# Patient Record
Sex: Female | Born: 1995
Health system: Southern US, Community
[De-identification: ages and names within clinical notes are randomized; demographics above are authoritative.]

## PROBLEM LIST (undated history)

## (undated) DIAGNOSIS — K589 Irritable bowel syndrome without diarrhea: Secondary | ICD-10-CM

## (undated) DIAGNOSIS — K219 Gastro-esophageal reflux disease without esophagitis: Secondary | ICD-10-CM

## (undated) DIAGNOSIS — F429 Obsessive-compulsive disorder, unspecified: Secondary | ICD-10-CM

## (undated) DIAGNOSIS — F419 Anxiety disorder, unspecified: Secondary | ICD-10-CM

## (undated) HISTORY — DX: Gastro-esophageal reflux disease without esophagitis: K21.9

## (undated) HISTORY — DX: Irritable bowel syndrome without diarrhea: K58.9

## (undated) HISTORY — DX: Anxiety disorder, unspecified: F41.9

## (undated) HISTORY — DX: Obsessive-compulsive disorder, unspecified: F42.9

---
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
## 2019-09-11 ENCOUNTER — Encounter: Payer: Self-pay | Admitting: Nurse Practitioner

## 2019-09-20 ENCOUNTER — Other Ambulatory Visit (INDEPENDENT_AMBULATORY_CARE_PROVIDER_SITE_OTHER): Payer: BC Managed Care – PPO

## 2019-09-20 ENCOUNTER — Encounter: Payer: Self-pay | Admitting: Nurse Practitioner

## 2019-09-20 ENCOUNTER — Encounter (INDEPENDENT_AMBULATORY_CARE_PROVIDER_SITE_OTHER): Payer: Self-pay

## 2019-09-20 ENCOUNTER — Ambulatory Visit: Payer: BC Managed Care – PPO | Admitting: Nurse Practitioner

## 2019-09-20 VITALS — BP 108/64 | HR 86 | Temp 98.7°F | Ht 68.75 in | Wt 154.0 lb

## 2019-09-20 DIAGNOSIS — R197 Diarrhea, unspecified: Secondary | ICD-10-CM

## 2019-09-20 DIAGNOSIS — Z8719 Personal history of other diseases of the digestive system: Secondary | ICD-10-CM

## 2019-09-20 DIAGNOSIS — R140 Abdominal distension (gaseous): Secondary | ICD-10-CM

## 2019-09-20 LAB — IGA: IgA: 120 mg/dL (ref 68–378)

## 2019-09-20 LAB — CBC
HCT: 36.3 % (ref 36.0–46.0)
Hemoglobin: 12.5 g/dL (ref 12.0–15.0)
MCHC: 34.3 g/dL (ref 30.0–36.0)
MCV: 86.5 fl (ref 78.0–100.0)
Platelets: 267 10*3/uL (ref 150.0–400.0)
RBC: 4.2 Mil/uL (ref 3.87–5.11)
RDW: 13.1 % (ref 11.5–15.5)
WBC: 5.5 10*3/uL (ref 4.0–10.5)

## 2019-09-20 LAB — HIGH SENSITIVITY CRP: CRP, High Sensitivity: 2.26 mg/L (ref 0.000–5.000)

## 2019-09-20 LAB — SEDIMENTATION RATE: Sed Rate: 12 mm/hr (ref 0–20)

## 2019-09-20 NOTE — Patient Instructions (Addendum)
If you are age 25 or older, your body mass index should be between 23-30. Your Body mass index is 22.91 kg/m. If this is out of the aforementioned range listed, please consider follow up with your Primary Care Provider.  If you are age 75 or younger, your body mass index should be between 19-25. Your Body mass index is 22.91 kg/m. If this is out of the aformentioned range listed, please consider follow up with your Primary Care Provider.   Your provider has requested that you go to the basement level for lab work before leaving today. Press "B" on the elevator. The lab is located at the first door on the left as you exit the elevator.  Follow up with me on 10/10/19 at 10 am.  Thank you for choosing me and [facility].   Willette Cluster, NP

## 2019-09-20 NOTE — Progress Notes (Signed)
ASSESSMENT / PLAN:   24 year old female with GAD, OCD and IBS  #History of IBS --Presenting with diarrhea, bloating, "gassiness".  --She can correlate symptoms with anxiety.  This recent flare in symptoms which started in January occurred after injuring foot and being on lockdown in [place].  Though back home now in [place] her symptoms have persisted though this has been in the setting of some recent medication changes for OCD/anxiety which she said increased her anxiety --She has been eating more weight than normal, wonders about celiac --Suspect symptoms are secondary to IBS.  Will rule check TTG and IgA for celiac disease. --Obtain ESR, CRP, CBC --Stool for lactoferrin --Follow-up with me in 3 weeks  HPI:     Chief Complaint: Diarrhea, bloating   Kelsey Armstrong is a 24 year old female, self-referred for evaluation of diarrhea and bloating.  Patient has been working on her masters degree in [place] history in [place].. She gives a history of IBS diagnosed in 2015 after a stomach flu.  She has been on probiotics ever since and done fairly well unless she stops the probiotics or during times of increased stress.  This past January patient injured her foot leading to decreased mobility.  This increased her level of stress /anxiety.  She subsequently developed diarrhea, bloating and gassiness.  On average she has 2-3 bowel movements a day.  60% of the time stools are loose.  Remainder of the time stools vary in consistency from being almost formed to soft.  She has not seen any blood in her stool.  No nocturnal stooling.  No unusual joint aches.  No family history of IBD .  She has not really tried anything for the diarrhea such as Imodium since 2015.  Patient saw a healthcare provider in [place] at the time and was prescribed an herbal tincture and things improved.  Now back with recurrent symptoms.  Patient does recall that she has been eating more wheat than normal.  She wonders  about celiac disease.  She has not noticed any correlation between her symptoms and dairy.  Has had some medication adjustments recently.  She was on BuSpar but it increased her anxiety (in February).  Her Prozac dose was recently increased but it also made the anxiety worse.  Past Medical History:  Diagnosis Date   Anxiety    GERD (gastroesophageal reflux disease)    IBS (irritable bowel syndrome)    OCD (obsessive compulsive disorder)      History reviewed. No pertinent surgical history. Family History  Problem Relation Age of Onset   Kidney cancer Maternal Grandfather    Lung cancer Maternal Grandfather    Colon cancer Paternal Grandmother    Breast cancer Paternal Grandmother    Skin cancer Paternal Grandmother    Social History   Tobacco Use   Smoking status: Never Smoker  Substance Use Topics   Alcohol use: Yes    Alcohol/week: 1.0 standard drinks    Types: 1 Glasses of wine per week    Comment: occasionally   Drug use: Never   Current Outpatient Medications  Medication Sig Dispense Refill   AMBULATORY NON FORMULARY MEDICATION Take 1 Dose by mouth in the morning, at noon, and at bedtime. Medication Name: Iberogast (natural ingredients)     Ascorbic Acid (VITAMIN C) 100 MG tablet Take 100 mg by mouth daily.     cholecalciferol (VITAMIN D3) 25 MCG (1000 UNIT) tablet Take 1,000 Units  by mouth daily.     FLUoxetine (PROZAC) 40 MG capsule Take by mouth.     lactobacillus acidophilus (BACID) TABS tablet Take 2 tablets by mouth 3 (three) times daily.     Multiple Vitamin (MULTIVITAMIN) tablet Take 1 tablet by mouth daily.     Probiotic Product (PROBIOTIC DAILY PO) Take 1 capsule by mouth daily.     No current facility-administered medications for this visit.   No Known Allergies   Review of Systems: Positive for anxiety, allergies, sore throat.  All other systems reviewed and negative except where noted in HPI.   Creatinine clearance cannot be  calculated (Patient's most recent lab result is older than the maximum 21 days allowed.)   Physical Exam:    Wt Readings from Last 3 Encounters:  09/20/19 154 lb (69.9 kg)    BP 108/64    Pulse 86    Temp 98.7 F (37.1 C)    Ht 5' 8.75" (1.746 m)    Wt 154 lb (69.9 kg)    BMI 22.91 kg/m  Constitutional:  Pleasant female in no acute distress. Psychiatric: Normal mood and affect. Behavior is normal. EENT: Pupils normal.  Conjunctivae are normal. No scleral icterus. Neck supple.  Cardiovascular: Normal rate, regular rhythm. No edema Pulmonary/chest: Effort normal and breath sounds normal. No wheezing, rales or rhonchi. Abdominal: Soft, nondistended, nontender. Bowel sounds active throughout. There are no masses palpable. No hepatomegaly. Neurological: Alert and oriented to person place and time. Skin: Skin is warm and dry. No rashes noted.  Tye Savoy, NP  09/20/2019, 10:38 AM  Cc:  Referring Provider No ref. provider found

## 2019-09-21 LAB — TISSUE TRANSGLUTAMINASE, IGA: (tTG) Ab, IgA: 1 U/mL

## 2019-09-21 NOTE — Progress Notes (Signed)
Reviewed and agree with documentation and assessment and plan. K. Veena Keiry Kowal , MD   

## 2019-10-10 ENCOUNTER — Ambulatory Visit: Payer: BC Managed Care – PPO | Admitting: Nurse Practitioner

## 2019-10-19 ENCOUNTER — Ambulatory Visit: Payer: BC Managed Care – PPO | Admitting: Nurse Practitioner

## 2019-11-08 ENCOUNTER — Ambulatory Visit: Payer: Self-pay | Admitting: Nurse Practitioner

## 2019-11-08 ENCOUNTER — Other Ambulatory Visit: Payer: Self-pay

## 2019-11-08 DIAGNOSIS — R140 Abdominal distension (gaseous): Secondary | ICD-10-CM

## 2019-11-08 DIAGNOSIS — R197 Diarrhea, unspecified: Secondary | ICD-10-CM

## 2019-11-09 LAB — FECAL LACTOFERRIN, QUANT
Fecal Lactoferrin: NEGATIVE
MICRO NUMBER:: 10474112
SPECIMEN QUALITY:: ADEQUATE
